# Patient Record
Sex: Female | Born: 1960 | Race: White | Hispanic: No | Marital: Single | Smoking: Never smoker
Health system: Southern US, Community
[De-identification: ages and names within clinical notes are randomized; demographics above are authoritative.]

## PROBLEM LIST (undated history)

## (undated) DIAGNOSIS — J45909 Unspecified asthma, uncomplicated: Secondary | ICD-10-CM

## (undated) DIAGNOSIS — F419 Anxiety disorder, unspecified: Secondary | ICD-10-CM

## (undated) DIAGNOSIS — M1990 Unspecified osteoarthritis, unspecified site: Secondary | ICD-10-CM

## (undated) DIAGNOSIS — I10 Essential (primary) hypertension: Secondary | ICD-10-CM

## (undated) DIAGNOSIS — G4730 Sleep apnea, unspecified: Secondary | ICD-10-CM

## (undated) HISTORY — PX: KNEE SURGERY: SHX244

## (undated) HISTORY — PX: TUBAL LIGATION: SHX77

## (undated) HISTORY — PX: ABDOMINAL SURGERY: SHX537

## (undated) HISTORY — PX: KNEE ARTHROSCOPY W/ MENISCAL REPAIR: SHX1877

## (undated) HISTORY — PX: CHOLECYSTECTOMY: SHX55

---

## 2013-01-21 ENCOUNTER — Other Ambulatory Visit (HOSPITAL_COMMUNITY): Payer: Self-pay | Admitting: Orthopaedic Surgery

## 2013-01-29 NOTE — H&P (Signed)
[facility]   A Division of [employer]   [address] [place], [place] [address] Telephone: [phone]  Fax: [phone]     PATIENT: Tina Acevedo, Tina Acevedo   MR#: [id number]  DOB: 1960/09/27       A 52 year old female returns with persistent neck pain she rates as severe that radiates from her neck into her left hand involving the long finger and index finger with numbness.  She had noticed some decreased strength in the left upper extremity, particularly with pushing, sometimes with activities that use her left hand for lifting heavier objects.  She did  not notice any specific grip weakness.  The patient has been treated with a Medrol Dosepak, has been treated on home exercise program, been through chiropractic treatment.  Prednisone pack was ineffective in April, although she felt somewhat better for about 2 days.  She has been treated with traction, manipulation, anti-inflammatories, without relief.  She is having difficulty sleeping.     CURRENT MEDICATIONS:  Include bupropion HCL ER (XL) 150 mg 1 p.o. daily, zolpidem 10 mg daily, losartan 50 mg daily, citalopram 20 mg daily, hydrochlorothiazide 25 mg daily.   ALLERGIES:  Erythromycin.    PAST MEDICAL/SURGICAL HISTORY:  Previous surgeries include knee arthroscopy of her right knee 2012, normal births 1989 and 1995, right knee lipoma removal 1991, admission in 2006 in the emergency room for several hours for hypertension and gallbladder removal 1996.     FAMILY HISTORY:  Positive for a grandmother who had colon cancer, grandfather had lung cancer, both on maternal side.    SOCIAL HISTORY:  She is married to her husband, Loraine Leriche.  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  She does not smoke, occasionally drinks a drink at dinner 2-3 times a week.    REVIEW OF SYSTEMS:  Positive for anxiety, arthritis, asthma, bronchitis, hypertension, sleep apnea, using a CPAP machine.  She has  partial non-removable dental implants.    PHYSICAL EXAMINATION:  The patient is alert and oriented.  She is 5 feet 2 inches, 212 pounds.  Extraocular movements intact.  She has significant brachial plexus tenderness on the left.  Positive Spurling on the left.  Moderate brachial tenderness on the right.  Biceps, triceps, wrist flexion/extension are normal to testing.  Reflexes are 2-3+ upper and lower and symmetrical.  Decreased sensation over the index and long finger and also radial side of her left hand.  Normal sensation right hand.  No evidence of peripheral nerve compression at the median nerve at the wrist or ulnar nerve at the elbow.     RADIOGRAPHS/TESTS:  Cervical MRI scan from 12/22/12 ordered by Dr. Loleta Dicker showed cervical spondylosis with right paracentral disk extrusion contacting the cord without significant deformity.  C6-7 showed moderate-sized disk left paracentral disk extrusion denting the ventral sac with effacement of posterior subarachnoid space.  Other levels showed no significant compression or extrusion.    PLAN:  We had a long discussion with the patient about options.  She has already been through an exercise program, chiropractic treatment, Medrol Dosepak, has been taking anti-inflammatories without relief.  She states the pain has gradually progressed over the last 6 months to the point where she feels that something has to be done about this and it bothers her on a daily basis with activities of daily living as well as wakes her up at night when she tries to sleep.  She has had a subacromial injection in her  shoulder, which did not change her symptoms.  We discussed options.  The patient would like to proceed with surgical treatment due to her progressive symptoms and failure of conservative treatment now greater than 6 months.  The plan would be 2-level anterior cervical diskectomy and fusion for removal of the paracentral disk, which is symptomatic at C6-7.  She has some  disk extrusion at C5-6 as well with spondylitic changes and the plan would be 2-level fusion, anterior plating, soft cervical collar x6 weeks postop, overnight stay in the hospital.  We discussed general anesthesia, operative technique, risks of surgery including dysphagia, dysphonia, pseudoarthrosis.  She understands and requests we proceed.     For additional information please see handwritten notes, reports, orders and prescriptions in this chart.      Ameenah Prosser C. Ophelia Charter, M.D.    Auto-Authenticated by Veverly Fells. Ophelia Charter, M.D.

## 2013-02-02 ENCOUNTER — Other Ambulatory Visit (HOSPITAL_COMMUNITY): Payer: Self-pay | Admitting: Orthopaedic Surgery

## 2013-02-02 MED ORDER — CEFAZOLIN SODIUM-DEXTROSE 2-3 GM-% IV SOLR
2.0000 g | INTRAVENOUS | Status: AC
  Administered 2013-02-03: 2 g via INTRAVENOUS

## 2013-02-02 NOTE — Progress Notes (Signed)
I was unable to reach patient by phone.  I left  A message on voice mail.  I instructed the patient to arrive at [facility] Main entrance at 10am, nothing to eat or drink after midnight.  I instructed patient to not take any medications in the am and to bring a  list of current medications  I asked patient to not wear any lotions, powders, cologne, jewelry, piercing, make-up or nail polish.  I asked the patient to call [phone], in the am if there were any questions or problems.

## 2013-02-03 ENCOUNTER — Encounter (HOSPITAL_COMMUNITY): Payer: Self-pay | Admitting: *Deleted

## 2013-02-03 ENCOUNTER — Encounter (HOSPITAL_COMMUNITY): Payer: BC Managed Care – PPO | Admitting: Anesthesiology

## 2013-02-03 ENCOUNTER — Ambulatory Visit (HOSPITAL_COMMUNITY): Payer: BC Managed Care – PPO

## 2013-02-03 ENCOUNTER — Inpatient Hospital Stay (HOSPITAL_COMMUNITY)
Admission: RE | Admit: 2013-02-03 | Discharge: 2013-02-04 | DRG: 473 | Disposition: A | Discharge status: Home / Self Care | Payer: BC Managed Care – PPO | Source: Ambulatory Visit | Attending: Orthopaedic Surgery | Admitting: Orthopaedic Surgery

## 2013-02-03 ENCOUNTER — Encounter (HOSPITAL_COMMUNITY): Payer: Self-pay | Admitting: Pharmacy Technician

## 2013-02-03 ENCOUNTER — Ambulatory Visit (HOSPITAL_COMMUNITY): Payer: BC Managed Care – PPO | Admitting: Anesthesiology

## 2013-02-03 ENCOUNTER — Encounter (HOSPITAL_COMMUNITY)
Admission: RE | Disposition: A | Discharge status: Home / Self Care | Payer: Self-pay | Source: Ambulatory Visit | Attending: Orthopaedic Surgery

## 2013-02-03 DIAGNOSIS — M47812 Spondylosis without myelopathy or radiculopathy, cervical region: Principal | ICD-10-CM | POA: Diagnosis present

## 2013-02-03 DIAGNOSIS — M5020 Other cervical disc displacement, unspecified cervical region: Secondary | ICD-10-CM | POA: Diagnosis present

## 2013-02-03 DIAGNOSIS — Z800 Family history of malignant neoplasm of digestive organs: Secondary | ICD-10-CM

## 2013-02-03 DIAGNOSIS — F411 Generalized anxiety disorder: Secondary | ICD-10-CM | POA: Diagnosis present

## 2013-02-03 DIAGNOSIS — J45909 Unspecified asthma, uncomplicated: Secondary | ICD-10-CM | POA: Diagnosis present

## 2013-02-03 DIAGNOSIS — Z801 Family history of malignant neoplasm of trachea, bronchus and lung: Secondary | ICD-10-CM

## 2013-02-03 DIAGNOSIS — I10 Essential (primary) hypertension: Secondary | ICD-10-CM | POA: Diagnosis present

## 2013-02-03 DIAGNOSIS — G4730 Sleep apnea, unspecified: Secondary | ICD-10-CM | POA: Diagnosis present

## 2013-02-03 HISTORY — DX: Unspecified osteoarthritis, unspecified site: M19.90

## 2013-02-03 HISTORY — DX: Anxiety disorder, unspecified: F41.9

## 2013-02-03 HISTORY — DX: Sleep apnea, unspecified: G47.30

## 2013-02-03 HISTORY — DX: Essential (primary) hypertension: I10

## 2013-02-03 HISTORY — PX: ANTERIOR CERVICAL DECOMP/DISCECTOMY FUSION: SHX1161

## 2013-02-03 HISTORY — DX: Unspecified asthma, uncomplicated: J45.909

## 2013-02-03 LAB — URINALYSIS, ROUTINE W REFLEX MICROSCOPIC
Glucose, UA: NEGATIVE mg/dL
Protein, ur: NEGATIVE mg/dL
Specific Gravity, Urine: 1.02 (ref 1.005–1.030)
pH: 5.5 (ref 5.0–8.0)

## 2013-02-03 LAB — COMPREHENSIVE METABOLIC PANEL
ALT: 23 U/L (ref 0–35)
AST: 18 U/L (ref 0–37)
Albumin: 4.1 g/dL (ref 3.5–5.2)
Alkaline Phosphatase: 81 U/L (ref 39–117)
Calcium: 9.5 mg/dL (ref 8.4–10.5)
Glucose, Bld: 102 mg/dL — ABNORMAL HIGH (ref 70–99)
Potassium: 4.1 mEq/L (ref 3.5–5.1)
Sodium: 140 mEq/L (ref 135–145)
Total Bilirubin: 0.3 mg/dL (ref 0.3–1.2)
Total Protein: 7.6 g/dL (ref 6.0–8.3)

## 2013-02-03 LAB — CBC
Hemoglobin: 12.8 g/dL (ref 12.0–15.0)
MCH: 32 pg (ref 26.0–34.0)
MCHC: 34.4 g/dL (ref 30.0–36.0)
Platelets: 199 10*3/uL (ref 150–400)
WBC: 5.4 10*3/uL (ref 4.0–10.5)

## 2013-02-03 LAB — URINE MICROSCOPIC-ADD ON

## 2013-02-03 LAB — SURGICAL PCR SCREEN
MRSA, PCR: NEGATIVE
Staphylococcus aureus: NEGATIVE

## 2013-02-03 LAB — PROTIME-INR: Prothrombin Time: 12.7 seconds (ref 11.6–15.2)

## 2013-02-03 SURGERY — ANTERIOR CERVICAL DECOMPRESSION/DISCECTOMY FUSION 2 LEVELS
Anesthesia: General | Site: Neck | Wound class: Clean

## 2013-02-03 MED ORDER — HYDROMORPHONE HCL PF 1 MG/ML IJ SOLN
INTRAMUSCULAR | Status: AC
  Filled 2013-02-03: qty 1

## 2013-02-03 MED ORDER — ARTIFICIAL TEARS OP OINT
TOPICAL_OINTMENT | OPHTHALMIC | Status: DC | PRN
  Administered 2013-02-03: 1 via OPHTHALMIC

## 2013-02-03 MED ORDER — NEOSTIGMINE METHYLSULFATE 1 MG/ML IJ SOLN
INTRAMUSCULAR | Status: DC | PRN
  Administered 2013-02-03: 5 mg via INTRAVENOUS

## 2013-02-03 MED ORDER — DEXTROSE 5 % IV SOLN
10.0000 mg | INTRAVENOUS | Status: DC | PRN
  Administered 2013-02-03: 20 ug/min via INTRAVENOUS

## 2013-02-03 MED ORDER — HYDROCODONE-ACETAMINOPHEN 5-325 MG PO TABS
1.0000 | ORAL_TABLET | ORAL | Status: DC | PRN
  Administered 2013-02-03 – 2013-02-04 (×2): 2 via ORAL
  Filled 2013-02-03 (×2): qty 2

## 2013-02-03 MED ORDER — BISACODYL 10 MG RE SUPP: 10.0000 mg | Freq: Every day | RECTAL | Status: DC | PRN

## 2013-02-03 MED ORDER — ROCURONIUM BROMIDE 100 MG/10ML IV SOLN
INTRAVENOUS | Status: DC | PRN
  Administered 2013-02-03: 50 mg via INTRAVENOUS
  Administered 2013-02-03: 10 mg via INTRAVENOUS
  Administered 2013-02-03: 20 mg via INTRAVENOUS
  Administered 2013-02-03: 10 mg via INTRAVENOUS

## 2013-02-03 MED ORDER — HEMOSTATIC AGENTS (NO CHARGE) OPTIME
TOPICAL | Status: DC | PRN
  Administered 2013-02-03: 1 via TOPICAL

## 2013-02-03 MED ORDER — ONDANSETRON HCL 4 MG/2ML IJ SOLN
4.0000 mg | INTRAMUSCULAR | Status: DC | PRN
  Administered 2013-02-03: 4 mg via INTRAVENOUS
  Filled 2013-02-03: qty 2

## 2013-02-03 MED ORDER — METHOCARBAMOL 500 MG PO TABS
500.0000 mg | ORAL_TABLET | Freq: Four times a day (QID) | ORAL | Status: DC | PRN
  Administered 2013-02-03 – 2013-02-04 (×3): 500 mg via ORAL
  Filled 2013-02-03 (×2): qty 1

## 2013-02-03 MED ORDER — LACTATED RINGERS IV SOLN
INTRAVENOUS | Status: DC
  Administered 2013-02-03: 12:00:00 via INTRAVENOUS

## 2013-02-03 MED ORDER — LACTATED RINGERS IV SOLN
INTRAVENOUS | Status: DC | PRN
  Administered 2013-02-03 (×2): via INTRAVENOUS

## 2013-02-03 MED ORDER — ONDANSETRON HCL 4 MG/2ML IJ SOLN
INTRAMUSCULAR | Status: AC
  Filled 2013-02-03: qty 2

## 2013-02-03 MED ORDER — PROPOFOL 10 MG/ML IV BOLUS
INTRAVENOUS | Status: DC | PRN
  Administered 2013-02-03: 200 mg via INTRAVENOUS

## 2013-02-03 MED ORDER — FLEET ENEMA 7-19 GM/118ML RE ENEM: 1.0000 | ENEMA | Freq: Once | RECTAL | Status: AC | PRN

## 2013-02-03 MED ORDER — PANTOPRAZOLE SODIUM 40 MG IV SOLR
40.0000 mg | Freq: Every day | INTRAVENOUS | Status: DC
  Administered 2013-02-03: 40 mg via INTRAVENOUS
  Filled 2013-02-03 (×2): qty 40

## 2013-02-03 MED ORDER — METHOCARBAMOL 100 MG/ML IJ SOLN
500.0000 mg | Freq: Four times a day (QID) | INTRAMUSCULAR | Status: DC | PRN
  Filled 2013-02-03: qty 5

## 2013-02-03 MED ORDER — ONDANSETRON HCL 4 MG/2ML IJ SOLN
INTRAMUSCULAR | Status: DC | PRN
  Administered 2013-02-03: 4 mg via INTRAVENOUS

## 2013-02-03 MED ORDER — ACETAMINOPHEN 325 MG PO TABS: 650.0000 mg | ORAL_TABLET | ORAL | Status: DC | PRN

## 2013-02-03 MED ORDER — THROMBIN 5000 UNITS EX SOLR
CUTANEOUS | Status: DC | PRN
  Administered 2013-02-03: 5000 [IU] via TOPICAL

## 2013-02-03 MED ORDER — GLYCOPYRROLATE 0.2 MG/ML IJ SOLN
INTRAMUSCULAR | Status: DC | PRN
  Administered 2013-02-03: .6 mg via INTRAVENOUS

## 2013-02-03 MED ORDER — FENTANYL CITRATE 0.05 MG/ML IJ SOLN
INTRAMUSCULAR | Status: DC | PRN
  Administered 2013-02-03: 150 ug via INTRAVENOUS
  Administered 2013-02-03: 100 ug via INTRAVENOUS
  Administered 2013-02-03: 150 ug via INTRAVENOUS
  Administered 2013-02-03: 50 ug via INTRAVENOUS

## 2013-02-03 MED ORDER — OXYCODONE-ACETAMINOPHEN 5-325 MG PO TABS
ORAL_TABLET | ORAL | Status: AC
  Filled 2013-02-03: qty 2

## 2013-02-03 MED ORDER — KCL IN DEXTROSE-NACL 20-5-0.45 MEQ/L-%-% IV SOLN
INTRAVENOUS | Status: DC
  Administered 2013-02-03 – 2013-02-04 (×2): via INTRAVENOUS
  Filled 2013-02-03 (×3): qty 1000

## 2013-02-03 MED ORDER — LIDOCAINE HCL (CARDIAC) 20 MG/ML IV SOLN
INTRAVENOUS | Status: DC | PRN
  Administered 2013-02-03: 80 mg via INTRAVENOUS

## 2013-02-03 MED ORDER — SODIUM CHLORIDE 0.9 % IV SOLN: 250.0000 mL | INTRAVENOUS | Status: DC

## 2013-02-03 MED ORDER — MENTHOL 3 MG MT LOZG: 1.0000 | LOZENGE | OROMUCOSAL | Status: DC | PRN

## 2013-02-03 MED ORDER — SENNOSIDES-DOCUSATE SODIUM 8.6-50 MG PO TABS: 1.0000 | ORAL_TABLET | Freq: Every evening | ORAL | Status: DC | PRN

## 2013-02-03 MED ORDER — MUPIROCIN 2 % EX OINT
TOPICAL_OINTMENT | CUTANEOUS | Status: AC
  Administered 2013-02-03: 1
  Filled 2013-02-03: qty 22

## 2013-02-03 MED ORDER — BUPIVACAINE-EPINEPHRINE 0.25% -1:200000 IJ SOLN
INTRAMUSCULAR | Status: DC | PRN
  Administered 2013-02-03: 6 mL

## 2013-02-03 MED ORDER — ONDANSETRON HCL 4 MG/2ML IJ SOLN
4.0000 mg | Freq: Once | INTRAMUSCULAR | Status: AC | PRN
  Administered 2013-02-03: 4 mg via INTRAVENOUS

## 2013-02-03 MED ORDER — ZOLPIDEM TARTRATE 5 MG PO TABS: 10.0000 mg | ORAL_TABLET | Freq: Every day | ORAL | Status: DC | PRN

## 2013-02-03 MED ORDER — 0.9 % SODIUM CHLORIDE (POUR BTL) OPTIME
TOPICAL | Status: DC | PRN
  Administered 2013-02-03: 1000 mL

## 2013-02-03 MED ORDER — METHOCARBAMOL 500 MG PO TABS
ORAL_TABLET | ORAL | Status: AC
  Filled 2013-02-03: qty 1

## 2013-02-03 MED ORDER — DOCUSATE SODIUM 100 MG PO CAPS
100.0000 mg | ORAL_CAPSULE | Freq: Two times a day (BID) | ORAL | Status: DC
  Administered 2013-02-03 – 2013-02-04 (×2): 100 mg via ORAL
  Filled 2013-02-03 (×2): qty 1

## 2013-02-03 MED ORDER — ZOLPIDEM TARTRATE 5 MG PO TABS: 5.0000 mg | ORAL_TABLET | Freq: Every evening | ORAL | Status: DC | PRN

## 2013-02-03 MED ORDER — OXYCODONE-ACETAMINOPHEN 5-325 MG PO TABS
1.0000 | ORAL_TABLET | ORAL | Status: DC | PRN
  Administered 2013-02-03 – 2013-02-04 (×2): 2 via ORAL
  Filled 2013-02-03: qty 2

## 2013-02-03 MED ORDER — ALBUTEROL SULFATE HFA 108 (90 BASE) MCG/ACT IN AERS: 2.0000 | INHALATION_SPRAY | Freq: Four times a day (QID) | RESPIRATORY_TRACT | Status: DC | PRN

## 2013-02-03 MED ORDER — CITALOPRAM HYDROBROMIDE 20 MG PO TABS
20.0000 mg | ORAL_TABLET | Freq: Every day | ORAL | Status: DC
  Administered 2013-02-03 – 2013-02-04 (×2): 20 mg via ORAL
  Filled 2013-02-03 (×2): qty 1

## 2013-02-03 MED ORDER — OXYCODONE-ACETAMINOPHEN 5-325 MG PO TABS: 1.0000 | ORAL_TABLET | ORAL | Status: AC | PRN

## 2013-02-03 MED ORDER — BUPROPION HCL ER (XL) 150 MG PO TB24
150.0000 mg | ORAL_TABLET | Freq: Every day | ORAL | Status: DC
  Administered 2013-02-03 – 2013-02-04 (×2): 150 mg via ORAL
  Filled 2013-02-03 (×2): qty 1

## 2013-02-03 MED ORDER — MORPHINE SULFATE 2 MG/ML IJ SOLN: 1.0000 mg | INTRAMUSCULAR | Status: DC | PRN

## 2013-02-03 MED ORDER — ACETAMINOPHEN 650 MG RE SUPP: 650.0000 mg | RECTAL | Status: DC | PRN

## 2013-02-03 MED ORDER — HYDROMORPHONE HCL PF 1 MG/ML IJ SOLN
0.2500 mg | INTRAMUSCULAR | Status: DC | PRN
  Administered 2013-02-03 (×2): 0.5 mg via INTRAVENOUS

## 2013-02-03 MED ORDER — MIDAZOLAM HCL 5 MG/5ML IJ SOLN
INTRAMUSCULAR | Status: DC | PRN
  Administered 2013-02-03: 2 mg via INTRAVENOUS

## 2013-02-03 MED ORDER — LORATADINE 10 MG PO TABS
10.0000 mg | ORAL_TABLET | Freq: Every day | ORAL | Status: DC
  Administered 2013-02-03 – 2013-02-04 (×2): 10 mg via ORAL
  Filled 2013-02-03 (×2): qty 1

## 2013-02-03 MED ORDER — LOSARTAN POTASSIUM 50 MG PO TABS
50.0000 mg | ORAL_TABLET | Freq: Every day | ORAL | Status: DC
  Administered 2013-02-03 – 2013-02-04 (×2): 50 mg via ORAL
  Filled 2013-02-03 (×2): qty 1

## 2013-02-03 MED ORDER — SODIUM CHLORIDE 0.9 % IJ SOLN
3.0000 mL | Freq: Two times a day (BID) | INTRAMUSCULAR | Status: DC
  Administered 2013-02-03: 3 mL via INTRAVENOUS

## 2013-02-03 MED ORDER — CEFAZOLIN SODIUM 1-5 GM-% IV SOLN
1.0000 g | Freq: Once | INTRAVENOUS | Status: AC
  Administered 2013-02-03: 1 g via INTRAVENOUS
  Filled 2013-02-03: qty 50

## 2013-02-03 MED ORDER — HYDROCHLOROTHIAZIDE 25 MG PO TABS
25.0000 mg | ORAL_TABLET | Freq: Every day | ORAL | Status: DC
  Administered 2013-02-03 – 2013-02-04 (×2): 25 mg via ORAL
  Filled 2013-02-03 (×2): qty 1

## 2013-02-03 MED ORDER — LIDOCAINE HCL 4 % MT SOLN
OROMUCOSAL | Status: DC | PRN
  Administered 2013-02-03: 4 mL via TOPICAL

## 2013-02-03 MED ORDER — PHENOL 1.4 % MT LIQD: 1.0000 | OROMUCOSAL | Status: DC | PRN

## 2013-02-03 MED ORDER — SODIUM CHLORIDE 0.9 % IJ SOLN: 3.0000 mL | INTRAMUSCULAR | Status: DC | PRN

## 2013-02-03 MED ORDER — THROMBIN 5000 UNITS EX SOLR
CUTANEOUS | Status: AC
  Filled 2013-02-03: qty 5000

## 2013-02-03 MED ORDER — METHOCARBAMOL 500 MG PO TABS: 500.0000 mg | ORAL_TABLET | Freq: Four times a day (QID) | ORAL | Status: AC | PRN

## 2013-02-03 SURGICAL SUPPLY — 62 items
ADH SKN CLS APL DERMABOND .7 (GAUZE/BANDAGES/DRESSINGS) ×1
BIT DRILL SKYLINE 12MM (BIT) IMPLANT
BLADE SURG ROTATE 9660 (MISCELLANEOUS) IMPLANT
BONE CERV LORDOTIC 14.5X12X6 (Bone Implant) ×4 IMPLANT
BUR ROUND FLUTED 4 SOFT TCH (BURR) ×1 IMPLANT
CLOTH BEACON ORANGE TIMEOUT ST (SAFETY) ×2 IMPLANT
COLLAR CERV LO CONTOUR FIRM DE (SOFTGOODS) ×2 IMPLANT
CORDS BIPOLAR (ELECTRODE) ×1 IMPLANT
COVER MAYO STAND STRL (DRAPES) ×1 IMPLANT
COVER SURGICAL LIGHT HANDLE (MISCELLANEOUS) ×2 IMPLANT
DERMABOND ADVANCED (GAUZE/BANDAGES/DRESSINGS) ×1
DERMABOND ADVANCED .7 DNX12 (GAUZE/BANDAGES/DRESSINGS) ×1 IMPLANT
DRAPE C-ARM 42X72 X-RAY (DRAPES) ×2 IMPLANT
DRAPE MICROSCOPE LEICA (MISCELLANEOUS) ×2 IMPLANT
DRAPE PROXIMA HALF (DRAPES) ×2 IMPLANT
DRILL BIT SKYLINE 12MM (BIT) ×2
DRSG MEPILEX BORDER 4X4 (GAUZE/BANDAGES/DRESSINGS) ×2 IMPLANT
DRSG MEPILEX BORDER 4X8 (GAUZE/BANDAGES/DRESSINGS) IMPLANT
DURAPREP 6ML APPLICATOR 50/CS (WOUND CARE) ×2 IMPLANT
ELECT COATED BLADE 2.86 ST (ELECTRODE) ×2 IMPLANT
ELECT REM PT RETURN 9FT ADLT (ELECTROSURGICAL) ×2
ELECTRODE REM PT RTRN 9FT ADLT (ELECTROSURGICAL) ×1 IMPLANT
EVACUATOR 1/8 PVC DRAIN (DRAIN) ×1 IMPLANT
GAUZE XEROFORM 1X8 LF (GAUZE/BANDAGES/DRESSINGS) ×1 IMPLANT
GLOVE BIO SURGEON STRL SZ7 (GLOVE) ×3 IMPLANT
GLOVE BIOGEL PI IND STRL 7.0 (GLOVE) ×1 IMPLANT
GLOVE BIOGEL PI IND STRL 7.5 (GLOVE) ×1 IMPLANT
GLOVE BIOGEL PI IND STRL 8 (GLOVE) ×1 IMPLANT
GLOVE BIOGEL PI INDICATOR 7.0 (GLOVE) ×1
GLOVE BIOGEL PI INDICATOR 7.5 (GLOVE) ×2
GLOVE BIOGEL PI INDICATOR 8 (GLOVE) ×1
GLOVE ECLIPSE 7.0 STRL STRAW (GLOVE) ×2 IMPLANT
GLOVE ORTHO TXT STRL SZ7.5 (GLOVE) ×2 IMPLANT
GLOVE SURG SS PI 6.5 STRL IVOR (GLOVE) ×1 IMPLANT
GOWN PREVENTION PLUS LG XLONG (DISPOSABLE) IMPLANT
GOWN PREVENTION PLUS XLARGE (GOWN DISPOSABLE) ×3 IMPLANT
GOWN STRL NON-REIN LRG LVL3 (GOWN DISPOSABLE) ×3 IMPLANT
GOWN STRL REIN XL XLG (GOWN DISPOSABLE) ×3 IMPLANT
GRAFT BNE SPCR VG2 14.5X12X6 (Bone Implant) IMPLANT
HEAD HALTER (SOFTGOODS) ×2 IMPLANT
HEMOSTAT SURGICEL 2X14 (HEMOSTASIS) IMPLANT
KIT BASIN OR (CUSTOM PROCEDURE TRAY) ×2 IMPLANT
KIT ROOM TURNOVER OR (KITS) ×2 IMPLANT
MANIFOLD NEPTUNE II (INSTRUMENTS) ×1 IMPLANT
NDL 25GX 5/8IN NON SAFETY (NEEDLE) ×1 IMPLANT
NEEDLE 25GX 5/8IN NON SAFETY (NEEDLE) ×2 IMPLANT
NS IRRIG 1000ML POUR BTL (IV SOLUTION) ×2 IMPLANT
PACK ORTHO CERVICAL (CUSTOM PROCEDURE TRAY) ×2 IMPLANT
PAD ARMBOARD 7.5X6 YLW CONV (MISCELLANEOUS) ×4 IMPLANT
PATTIES SURGICAL .5 X.5 (GAUZE/BANDAGES/DRESSINGS) IMPLANT
PIN TEMP SKYLINE THREADED (PIN) ×2 IMPLANT
PLATE TWO LEVEL SKYLINE 30MM (Plate) ×1 IMPLANT
SCREW VARIABLE SELF TAP 12MM (Screw) ×6 IMPLANT
SPONGE GAUZE 4X4 12PLY (GAUZE/BANDAGES/DRESSINGS) IMPLANT
SPONGE SURGIFOAM ABS GEL SZ50 (HEMOSTASIS) ×2 IMPLANT
SURGIFLO TRUKIT (HEMOSTASIS) IMPLANT
SUT VIC AB 3-0 X1 27 (SUTURE) ×2 IMPLANT
SUT VICRYL 4-0 PS2 18IN ABS (SUTURE) ×4 IMPLANT
SYR 30ML SLIP (SYRINGE) ×2 IMPLANT
TOWEL OR 17X24 6PK STRL BLUE (TOWEL DISPOSABLE) ×2 IMPLANT
TOWEL OR 17X26 10 PK STRL BLUE (TOWEL DISPOSABLE) ×2 IMPLANT
WATER STERILE IRR 1000ML POUR (IV SOLUTION) ×1 IMPLANT

## 2013-02-03 NOTE — Anesthesia Preprocedure Evaluation (Signed)
Anesthesia Evaluation  Patient identified by MRN, date of birth, ID band Patient awake    Reviewed: Allergy & Precautions, H&P , NPO status , Patient's Chart, lab work & pertinent test results  Airway       Dental   Pulmonary asthma , sleep apnea ,          Cardiovascular hypertension,     Neuro/Psych    GI/Hepatic   Endo/Other    Renal/GU      Musculoskeletal   Abdominal   Peds  Hematology   Anesthesia Other Findings   Reproductive/Obstetrics                           Anesthesia Physical Anesthesia Plan  ASA: II  Anesthesia Plan: General   Post-op Pain Management:    Induction: Intravenous  Airway Management Planned:   Additional Equipment:   Intra-op Plan:   Post-operative Plan: Extubation in OR  Informed Consent: I have reviewed the patients History and Physical, chart, labs and discussed the procedure including the risks, benefits and alternatives for the proposed anesthesia with the patient or authorized representative who has indicated his/her understanding and acceptance.     Plan Discussed with: CRNA, Anesthesiologist and Surgeon  Anesthesia Plan Comments:         Anesthesia Quick Evaluation

## 2013-02-03 NOTE — Anesthesia Postprocedure Evaluation (Signed)
  Anesthesia Post-op Note  Patient: Tina Acevedo  Procedure(s) Performed: Procedure(s) with comments: ANTERIOR CERVICAL DECOMPRESSION/DISCECTOMY FUSION 2 LEVELS (N/A) - C5-6 C6-7 Anterior Cervical Discectomy and Fusion, Allograft, Plate  Patient Location: PACU  Anesthesia Type:General  Level of Consciousness: awake, alert , oriented and patient cooperative  Airway and Oxygen Therapy: Patient Spontanous Breathing  Post-op Pain: mild  Post-op Assessment: Post-op Vital signs reviewed, Patient's Cardiovascular Status Stable, Respiratory Function Stable, Patent Airway, No signs of Nausea or vomiting and Pain level controlled  Post-op Vital Signs: stable  Complications: No apparent anesthesia complications

## 2013-02-03 NOTE — H&P (View-Only) (Signed)
I was unable to reach patient by phone.  I left  A message on voice mail.  I instructed the patient to arrive at [facility] Main entrance at 10am, nothing to eat or drink after midnight.  I instructed patient to not take any medications in the am and to bring a  list of current medications  I asked patient to not wear any lotions, powders, cologne, jewelry, piercing, make-up or nail polish.  I asked the patient to call [phone], in the am if there were any questions or problems. 

## 2013-02-03 NOTE — Interval H&P Note (Signed)
History and Physical Interval Note:  02/03/2013 12:52 PM  Tina Acevedo  has presented today for surgery, with the diagnosis of C5-6, C6-7 Cervical Spondylosis with HNP  The various methods of treatment have been discussed with the patient and family. After consideration of risks, benefits and other options for treatment, the patient has consented to  Procedure(s) with comments: ANTERIOR CERVICAL DECOMPRESSION/DISCECTOMY FUSION 2 LEVELS (N/A) - C5-6 C6-7 Anterior Cervical Discectomy and Fusion, Allograft, Plate as a surgical intervention .  The patient's history has been reviewed, patient examined, no change in status, stable for surgery.  I have reviewed the patient's chart and labs.  Questions were answered to the patient's satisfaction.     YATES,MARK C

## 2013-02-03 NOTE — Anesthesia Procedure Notes (Addendum)
Performed by: Coralee Rud   Procedure Name: Intubation Date/Time: 02/03/2013 1:30 PM Performed by: Coralee Rud Pre-anesthesia Checklist: Patient identified, Emergency Drugs available, Suction available and Patient being monitored Patient Re-evaluated:Patient Re-evaluated prior to inductionOxygen Delivery Method: Circle system utilized Preoxygenation: Pre-oxygenation with 100% oxygen Intubation Type: IV induction Ventilation: Mask ventilation without difficulty Laryngoscope Size: Miller and 3 Grade View: Grade II Tube type: Oral Tube size: 7.5 mm Number of attempts: 1 Airway Equipment and Method: Bougie stylet Placement Confirmation: ETT inserted through vocal cords under direct vision,  positive ETCO2 and breath sounds checked- equal and bilateral Secured at: 22 cm Tube secured with: Tape Dental Injury: Teeth and Oropharynx as per pre-operative assessment  Comments: Elective Bougie intubation.  Anterior larynx, small mouth and full dentition.

## 2013-02-03 NOTE — Interval H&P Note (Signed)
History and Physical Interval Note:  02/03/2013 12:51 PM  Tina Acevedo  has presented today for surgery, with the diagnosis of C5-6, C6-7 Cervical Spondylosis with HNP  The various methods of treatment have been discussed with the patient and family. After consideration of risks, benefits and other options for treatment, the patient has consented to  Procedure(s) with comments: ANTERIOR CERVICAL DECOMPRESSION/DISCECTOMY FUSION 2 LEVELS (N/A) - C5-6 C6-7 Anterior Cervical Discectomy and Fusion, Allograft, Plate as a surgical intervention .  The patient's history has been reviewed, patient examined, no change in status, stable for surgery.  I have reviewed the patient's chart and labs.  Questions were answered to the patient's satisfaction.     Jennah Satchell C

## 2013-02-03 NOTE — Brief Op Note (Signed)
02/03/2013  3:43 PM  PATIENT:  Tina Acevedo  52 y.o. female  PRE-OPERATIVE DIAGNOSIS:  C5-6, C6-7 Cervical Spondylosis with HNP  POST-OPERATIVE DIAGNOSIS:  C5-6, C6-7 Cervical Spondylosis with HNP  PROCEDURE:  Procedure(s) with comments: ANTERIOR CERVICAL DECOMPRESSION/DISCECTOMY FUSION 2 LEVELS (N/A) - C5-6 C6-7 Anterior Cervical Discectomy and Fusion, Allograft, Plate  SURGEON:  Surgeon(s) and Role:    * Eldred Manges, MD - Primary  PHYSICIAN ASSISTANT: Maurie Boettcher  ASSISTANTS: none   ANESTHESIA:   general  EBL:  Total I/O In: 1000 [I.V.:1000] Out: 50 [Blood:50]  BLOOD ADMINISTERED:none  DRAINS: (1) Hemovact drain(s) in the anterior nec, with  Suction Open   LOCAL MEDICATIONS USED:  MARCAINE     SPECIMEN:  No Specimen  DISPOSITION OF SPECIMEN:  N/A  COUNTS:  YES  TOURNIQUET:  * No tourniquets in log *  DICTATION: .Note written in EPIC  PLAN OF CARE: Admit to inpatient   PATIENT DISPOSITION:  PACU - hemodynamically stable.   Delay start of Pharmacological VTE agent (>24hrs) due to surgical blood loss or risk of bleeding: yes

## 2013-02-03 NOTE — Transfer of Care (Signed)
Immediate Anesthesia Transfer of Care Note  Patient: Tina Acevedo  Procedure(s) Performed: Procedure(s) with comments: ANTERIOR CERVICAL DECOMPRESSION/DISCECTOMY FUSION 2 LEVELS (N/A) - C5-6 C6-7 Anterior Cervical Discectomy and Fusion, Allograft, Plate  Patient Location: PACU  Anesthesia Type:General  Level of Consciousness: awake, alert , oriented and patient cooperative  Airway & Oxygen Therapy: Patient Spontanous Breathing and Patient connected to nasal cannula oxygen  Post-op Assessment: Report given to PACU RN, Post -op Vital signs reviewed and stable and Patient moving all extremities  Post vital signs: Reviewed and stable  Complications: No apparent anesthesia complications

## 2013-02-04 LAB — URINE CULTURE: Colony Count: 6000

## 2013-02-04 NOTE — Progress Notes (Signed)
Subjective: 1 Day Post-Op Procedure(s) (LRB): ANTERIOR CERVICAL DECOMPRESSION/DISCECTOMY FUSION 2 LEVELS (N/A) Patient reports pain as mild.    Objective: Vital signs in last 24 hours: Temp:  [97 F (36.1 C)-98.5 F (36.9 C)] 97.5 F (36.4 C) (11/20 0433) Pulse Rate:  [57-89] 57 (11/20 0433) Resp:  [12-21] 16 (11/20 0433) BP: (99-156)/(56-92) 99/56 mmHg (11/20 0433) SpO2:  [81 %-100 %] 98 % (11/20 0433) Weight:  [98.601 kg (217 lb 6 oz)] 98.601 kg (217 lb 6 oz) (11/19 1121)  Intake/Output from previous day: 11/19 0701 - 11/20 0700 In: 2452 [I.V.:2452] Out: 50 [Blood:50] Intake/Output this shift:     Recent Labs  02/03/13 1051  HGB 12.8    Recent Labs  02/03/13 1051  WBC 5.4  RBC 4.00  HCT 37.2  PLT 199    Recent Labs  02/03/13 1051  NA 140  K 4.1  CL 102  CO2 27  BUN 11  CREATININE 0.87  GLUCOSE 102*  CALCIUM 9.5    Recent Labs  02/03/13 1051  INR 0.97    Neurologically intact  Assessment/Plan: 1 Day Post-Op Procedure(s) (LRB): ANTERIOR CERVICAL DECOMPRESSION/DISCECTOMY FUSION 2 LEVELS (N/A) Plan:  Discharge home. No arm pain, good relief of preop UE numbness and weakness.   Karysa Heft C 02/04/2013, 7:50 AM

## 2013-02-04 NOTE — Progress Notes (Signed)
Orthopedic Tech Progress Note Patient Details:  Tina Acevedo 04/19/1960 [id number]  Ortho Devices Type of Ortho Device: Soft collar   Haskell Flirt 02/04/2013, 8:07 AM

## 2013-02-04 NOTE — Op Note (Signed)
Tina Acevedo, Tina Acevedo NO.:  [id number]  MEDICAL RECORD NO.:  [id number]  LOCATION:  [id number]                        FACILITY:  [facility]  PHYSICIAN:  Zach Tietje C. Ophelia Charter, M.D.    DATE OF BIRTH:  Jan 19, 1961  DATE OF PROCEDURE:  02/03/2013 DATE OF DISCHARGE:                              OPERATIVE REPORT   PREOPERATIVE DIAGNOSES:  Cervical spondylosis, C5-6, C6-7, with C6-7 herniated nucleus pulposus.  POSTOPERATIVE DIAGNOSIS:  Cervical spondylosis, C5-6, C6-7, with C6-7 herniated nucleus pulposus.  PROCEDURE:  C5-6, C6-7 anterior cervical discectomy and fusion, allograft and plate.  DePuy Skyline plate and 12 mm screws x6.  SURGEON:  Liam Cammarata C. Ophelia Charter, M.D.  ASSISTANT:  Maud Deed, PA-C, medically necessary and present for the entire procedure.  ANESTHESIA:  GOT plus Marcaine skin local 6 mL.  DRAINS:  One Hemovac neck.  COMPLICATIONS:  None.  PROCEDURE IN DETAIL:  After induction of general anesthesia, head halter traction.  Arms tucked at the side __________ Preoperative Ancef prophylaxis, time-out procedure.  Neck was prepped with DuraPrep.  The area was squared with towels, sterile skin marker was used in the prominent skin fold based on palpable landmarks and Betadine, Steri- Drape was applied, thyroid sheets, sterile Mayo stand at the head. Incision was made starting at the midline extending to the left directly over the C6 vertebral body based on cricothyroid palpation and carotid tubercle as well as position of the clavicle.  Platysma was divided in line with the fibers.  Blunt dissection below the omohyoid down to the prominent spur at C6-7 was performed and then at the C5-6 level where there was a small anterior spur short 25 needle was placed with a straight clamp confirmed with cross-table lateral x-ray.  C6-7 had moderate-sized left paracentral disc extrusion indenting the spinal cord with compression and C6-7 was operatively addressed.  First was  Cloward teeth retractors right and left, smooth blades up and down.  Discectomy was performed.  Bur was used to remove the spurs anteriorly.  Operative microscope was draped and brought in and remaining portion of the disk material was removed down to the posterior longitudinal ligament, taking chunks of disk off and decompressing until dura was completely visualized all the way across and a trial sizer for 6 gave a tight fit. Uncovertebral spurs were removed right and left.  There were some epidural veins that elliptically where the space was restored that had narrowed by 50% at C6-7.  Anterior spurs off of C6 and C7, primarily C6 had been removed with the bur to allow the plate to sit down flat against the vertebral body.  Thrombin Gelfoam was placed and then final graft was opened. Corticocancellous marked anteriorly at the midline.  CRNA pulled on head halter traction as the graft was inserted countersunk 2 mm.  Identical procedure was repeated at C5-6.  At this level, the disk space was wider and still fit with a 6 mm graft.  Uncovertebral joints were stripped. There was some disk protrusion, spondylitic changes, and degenerative uncovertebral joints in both gutters, but there was no was no moderate- sized disk causing compression as had been present at C6-7.  There was some right paracentral  disk extrusion with slight displacement and this was decompressed until the dura was fully visualized.  Thrombin soaked Gelfoam was placed, then removed and then placement of the graft with __________ traction.  Appropriate plate was selected, held with a prong, checked under fluoroscopy and then holes were filled, rechecked under fluoroscopy and screws locked down.  Graft, plate, screws were in good position.  After irrigation with saline solution, the finger was placed down toward the chest anterior to the vertebral body to make sure there was room for any egress of fluid.  Operative field  was dry.  Copious irrigation, closer of the platysma, after Hemovac was placed.  Separate stab incision in  and out technique in line with the skin incision and placed down over the top of the plate.  A 3-0 on the platysma, 4-0 Vicryl subcuticular closure.  Tincture of benzoin, Steri-Strips, Marcaine infiltration, 4x4s, soft cervical collar tape.  Instrument count and needle count was correct.  The patient tolerated the procedure well and was transferred to recovery room in stable condition.     Tina Acevedo C. Ophelia Charter, M.D.     MCY/MEDQ  D:  02/03/2013  T:  02/04/2013  Job:  [id number]

## 2013-02-04 NOTE — Care Management Utilization Note (Signed)
Utilization review completed. Deonna Krummel, RN BSN 

## 2013-02-05 ENCOUNTER — Encounter (HOSPITAL_COMMUNITY): Payer: Self-pay | Admitting: Orthopaedic Surgery

## 2013-02-22 NOTE — Discharge Summary (Signed)
Physician Discharge Summary  Patient ID: Tina Acevedo MRN: [id number] DOB/AGE: 53-02-1961 53 y.o.  Admit date: 02/03/2013 Discharge date: 02/02/2013  Admission Diagnoses:  HNP (herniated nucleus pulposus), cervical C6-7 and cervical spondylosis C5-6  Discharge Diagnoses:  Principal Problem:   HNP (herniated nucleus pulposus), cervical same as above.  Past Medical History  Diagnosis Date  . Hypertension   . Anxiety   . Asthma   . Sleep apnea   . Arthritis     Surgeries: Procedure(s): ANTERIOR CERVICAL DECOMPRESSION/DISCECTOMY FUSION C5-6 and C6-7, 2 LEVELS on 02/03/2013   Consultants (if any):  none  Discharged Condition: Improved  Hospital Course: Tina Acevedo is an 52 y.o. female who was admitted 02/03/2013 with a diagnosis of HNP (herniated nucleus pulposus), cervical and went to the operating room on 02/03/2013 and underwent the above named procedures.    She was given perioperative antibiotics:  Anti-infectives   Start     Dose/Rate Route Frequency Ordered Stop   02/03/13 1930  ceFAZolin (ANCEF) IVPB 1 g/50 mL premix     1 g 100 mL/hr over 30 Minutes Intravenous  Once 02/03/13 1737 02/03/13 2053   02/03/13 0600  ceFAZolin (ANCEF) IVPB 2 g/50 mL premix     2 g 100 mL/hr over 30 Minutes Intravenous On call to O.R. 02/02/13 1430 02/03/13 1322    Good relief of preop UE numbness and weakness.  She was given sequential compression devices, early ambulation for DVT prophylaxis. She benefited maximally from the hospital stay and there were no complications.    Recent vital signs:  Filed Vitals:   02/04/13 0433  BP: 99/56  Pulse: 57  Temp: 97.5 F (36.4 C)  Resp: 16    Recent laboratory studies:  Lab Results  Component Value Date   HGB 12.8 02/03/2013   Lab Results  Component Value Date   WBC 5.4 02/03/2013   PLT 199 02/03/2013   Lab Results  Component Value Date   INR 0.97 02/03/2013   Lab Results  Component Value Date   NA 140 02/03/2013   K 4.1 02/03/2013   CL 102 02/03/2013   CO2 27 02/03/2013   BUN 11 02/03/2013   CREATININE 0.87 02/03/2013   GLUCOSE 102* 02/03/2013    Discharge Medications:     Medication List         albuterol 108 (90 BASE) MCG/ACT inhaler  Commonly known as:  PROVENTIL HFA;VENTOLIN HFA  Inhale 2 puffs into the lungs every 6 (six) hours as needed for wheezing or shortness of breath.     buPROPion 150 MG 24 hr tablet  Commonly known as:  WELLBUTRIN XL  Take 150 mg by mouth daily.     cetirizine 10 MG tablet  Commonly known as:  ZYRTEC  Take 10 mg by mouth daily.     citalopram 20 MG tablet  Commonly known as:  CELEXA  Take 20 mg by mouth daily.     diphenhydramine-acetaminophen 25-500 MG Tabs  Commonly known as:  TYLENOL PM  Take 1 tablet by mouth at bedtime as needed (for sleep).     hydrochlorothiazide 25 MG tablet  Commonly known as:  HYDRODIURIL  Take 25 mg by mouth daily.     losartan 50 MG tablet  Commonly known as:  COZAAR  Take 50 mg by mouth daily.     methocarbamol 500 MG tablet  Commonly known as:  ROBAXIN  Take 1 tablet (500 mg total) by mouth every 6 (six) hours as needed  for muscle spasms (spasm).     oxyCODONE-acetaminophen 5-325 MG per tablet  Commonly known as:  ROXICET  Take 1-2 tablets by mouth every 4 (four) hours as needed for severe pain.     zolpidem 10 MG tablet  Commonly known as:  AMBIEN  Take 10 mg by mouth daily as needed. For sleep        Diagnostic Studies: Dg Chest 2 View  02/03/2013   CLINICAL DATA:  Cervical spine surgery  EXAM: CHEST  2 VIEW  COMPARISON:  None.  FINDINGS: The heart size and mediastinal contours are within normal limits. Both lungs are clear. The visualized skeletal structures are unremarkable.  IMPRESSION: No active cardiopulmonary disease.   Electronically Signed   By: Elige Ko   On: 02/03/2013 12:02   Dg Cervical Spine 2-3 Views  02/03/2013   CLINICAL DATA:  Cervical disc disease.  EXAM: CERVICAL SPINE - 2-3 VIEW;  DG C-ARM 1-60 MIN  COMPARISON:  Radiographs dated 02/03/2013  FINDINGS: Multiple C-arm images demonstrate the patient has undergone anterior cervical fusion at C5-6 and C6-7. Anterior plate and screws appear in good position. Alignment of the vertebrae is anatomic. Interbody fusion plugs in place.  IMPRESSION: Anterior cervical fusions performed at C5-6 and C6-7.   Electronically Signed   By: Geanie Cooley M.D.   On: 02/03/2013 16:39   Dg Cervical Spine 2-3 Views  02/03/2013   CLINICAL DATA:  Anterior cervical decompression, discectomy, and 2 level fusion  EXAM: CERVICAL SPINE - 2-3 VIEW  COMPARISON:  02/03/2013  FINDINGS: Hemostats project at the C5-6 disc space level. Stent a tractor is identified within the anterior soft tissues at the base of the neck. An endotracheal tube is appreciated.  IMPRESSION: Cervical spine localization evaluation.   Electronically Signed   By: Salome Holmes M.D.   On: 02/03/2013 14:18   Dg Cervical Spine 2 Or 3 Views  02/03/2013   CLINICAL DATA:  Preop cervical spondylosis  EXAM: CERVICAL SPINE - 2-3 VIEW  COMPARISON:  None.  FINDINGS: The cervical spine is visualized to the level of C7.The vertebral body heights are maintained. The alignment is normal. The prevertebral soft tissues are normal. There is no acute fracture or static listhesis. There is mild degenerative disc disease and degenerative facet arthropathy at C6-C7.  IMPRESSION: Mild degenerative disc disease at C6-7.   Electronically Signed   By: Elige Ko   On: 02/03/2013 12:05   Dg C-arm 1-60 Min  02/03/2013   CLINICAL DATA:  Cervical disc disease.  EXAM: CERVICAL SPINE - 2-3 VIEW; DG C-ARM 1-60 MIN  COMPARISON:  Radiographs dated 02/03/2013  FINDINGS: Multiple C-arm images demonstrate the patient has undergone anterior cervical fusion at C5-6 and C6-7. Anterior plate and screws appear in good position. Alignment of the vertebrae is anatomic. Interbody fusion plugs in place.  IMPRESSION: Anterior cervical  fusions performed at C5-6 and C6-7.   Electronically Signed   By: Geanie Cooley M.D.   On: 02/03/2013 16:39    Disposition: 01-Home or Self Care Discharge instructions: No lifting greater than 10 lbs. No overhead use of arms. Avoid bending,and twisting neck. Wear collar at all times. Walk in house for first week them may start to get out slowly increasing distance up to one mile by 3 weeks post op. Keep incision dry for 3 days, may then bathe and wet incision using a covered collar when showering. Call if any fevers >101, chills, or increasing numbness or weakness or increased swelling  or drainage.       Follow-up Information   Follow up with Eldred Manges, MD. Schedule an appointment as soon as possible for a visit in 2 weeks. (or as scheduled)    Specialty:  Orthopedic Surgery   Contact information:   [address] [place] [address] [phone]        Signed: Wende Neighbors 02/22/2013, 9:19 AM

## 2015-07-22 IMAGING — RF DG CERVICAL SPINE 2 OR 3 VIEWS
1 series · 4 of 4 positions shown · non-contrast
Comparison: Radiographs dated 02/03/2013

CLINICAL DATA: Cervical disc disease.

EXAM:
CERVICAL SPINE - 2-3 VIEW; DG C-ARM 1-60 MIN

[Series 1: run · 4 of 4 slices shown]
[im 1/4]
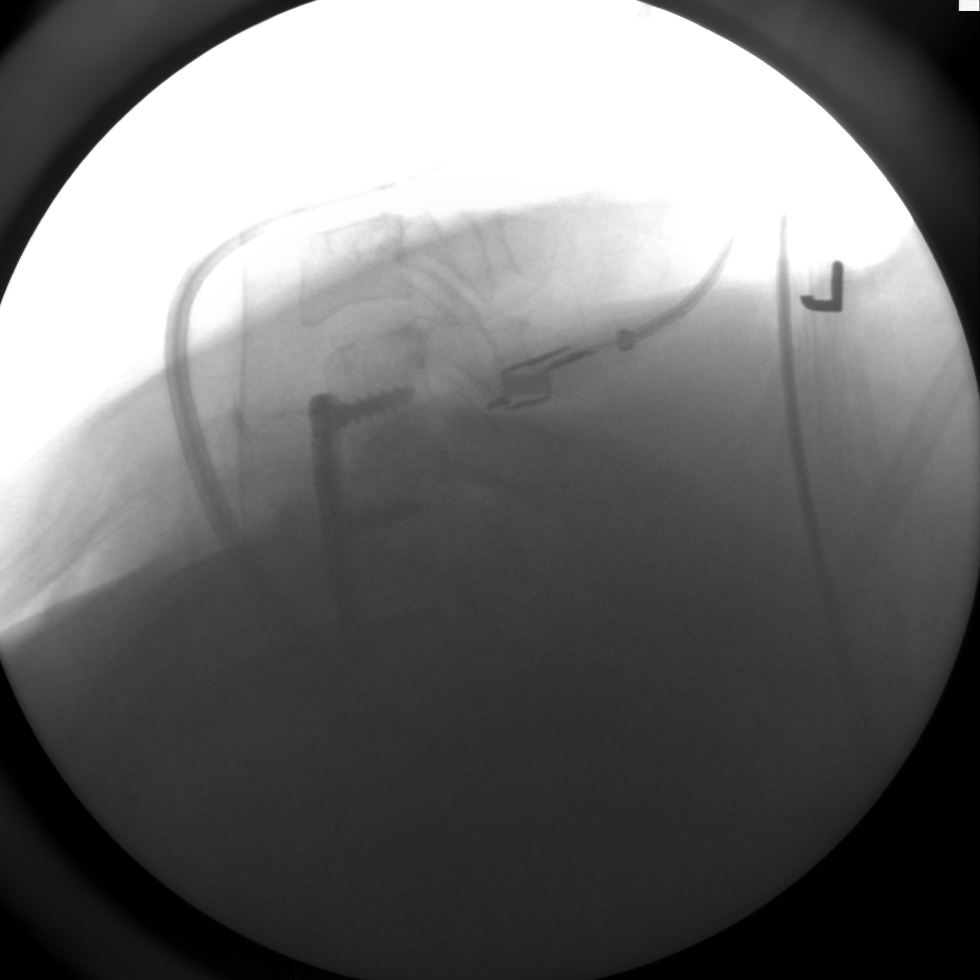
[im 2/4]
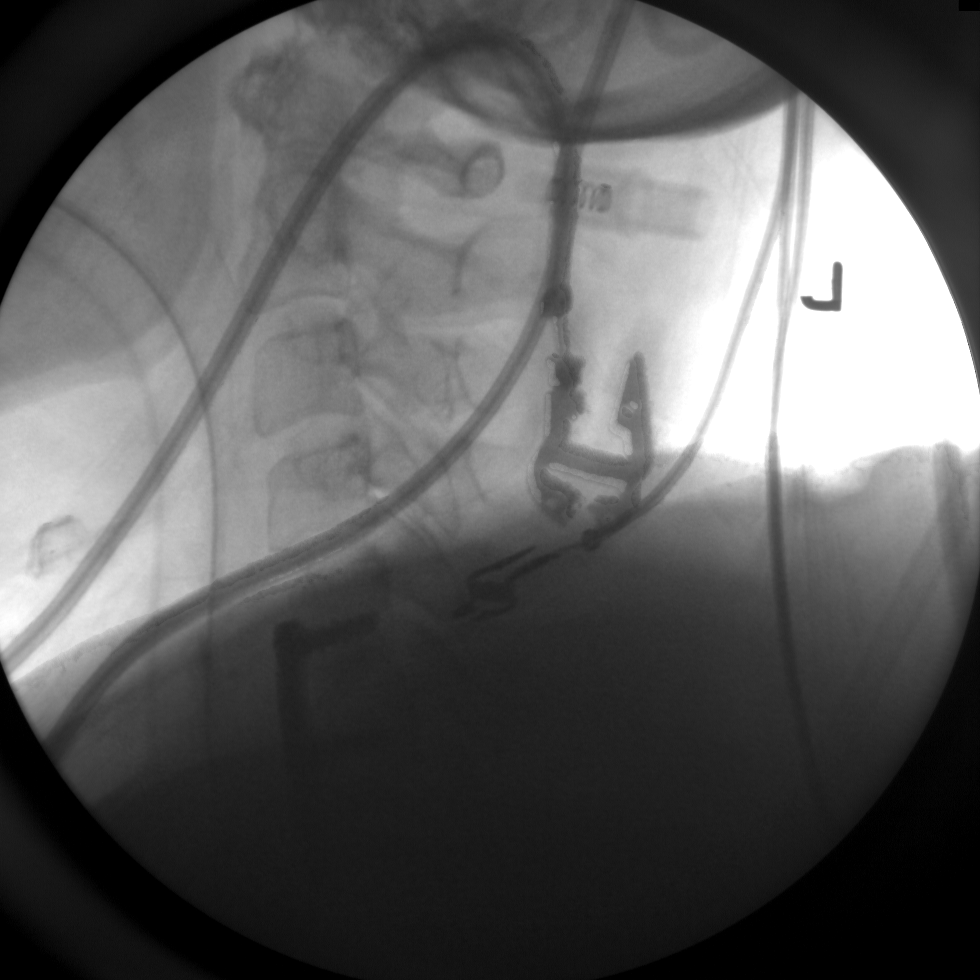
[im 3/4]
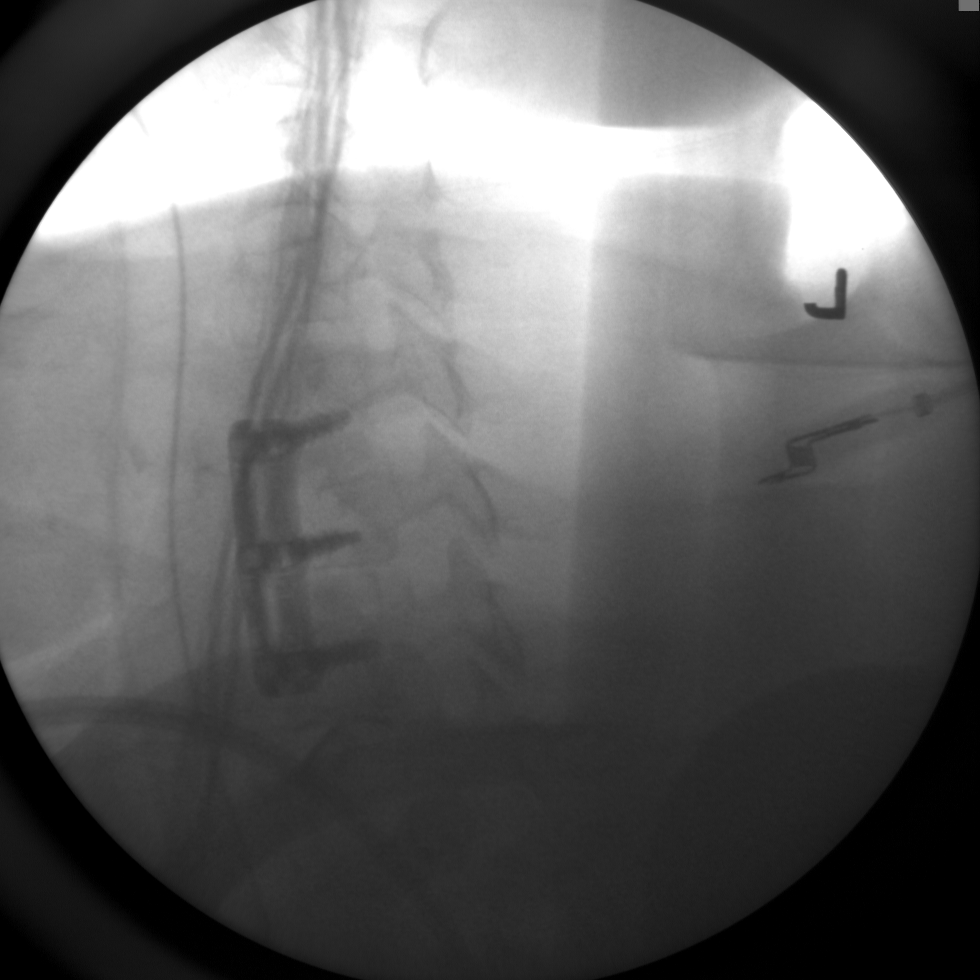
[im 4/4]
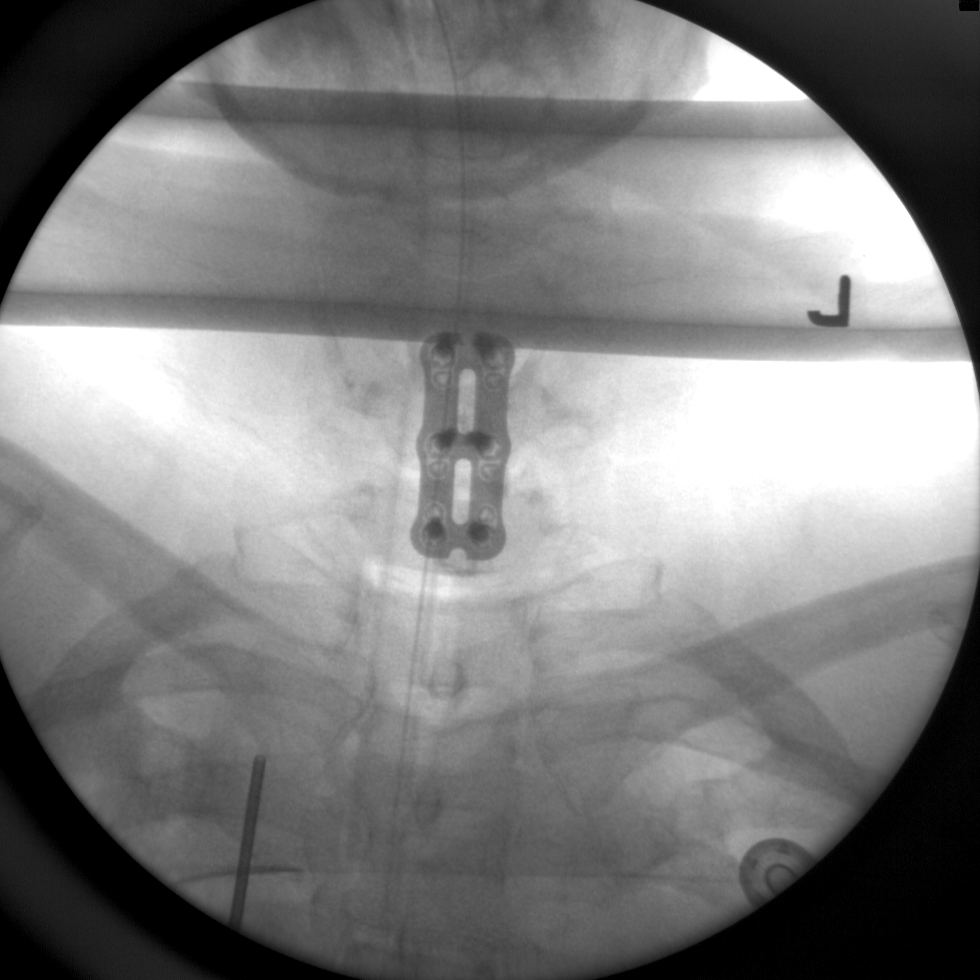

[4 of 4 positions shown; findings below may reference images not displayed]

FINDINGS: Multiple C-arm images demonstrate the patient has undergone anterior
cervical fusion at C5-6 and C6-7. Anterior plate and screws appear
in good position. Alignment of the vertebrae is anatomic. Interbody
fusion plugs in place.
IMPRESSION: Anterior cervical fusions performed at C5-6 and C6-7.

## 2020-07-05 ENCOUNTER — Ambulatory Visit (INDEPENDENT_AMBULATORY_CARE_PROVIDER_SITE_OTHER): Payer: No Typology Code available for payment source | Admitting: Orthopaedic Surgery

## 2020-07-05 ENCOUNTER — Telehealth: Payer: Self-pay

## 2020-07-05 ENCOUNTER — Encounter: Payer: Self-pay | Admitting: Orthopaedic Surgery

## 2020-07-05 ENCOUNTER — Ambulatory Visit (INDEPENDENT_AMBULATORY_CARE_PROVIDER_SITE_OTHER): Payer: No Typology Code available for payment source

## 2020-07-05 VITALS — Ht 61.5 in | Wt 229.0 lb

## 2020-07-05 DIAGNOSIS — M25561 Pain in right knee: Secondary | ICD-10-CM

## 2020-07-05 DIAGNOSIS — G8929 Other chronic pain: Secondary | ICD-10-CM

## 2020-07-05 DIAGNOSIS — M1711 Unilateral primary osteoarthritis, right knee: Secondary | ICD-10-CM | POA: Diagnosis not present

## 2020-07-05 MED ORDER — METHYLPREDNISOLONE ACETATE 40 MG/ML IJ SUSP
40.0000 mg | INTRAMUSCULAR | Status: AC | PRN
  Administered 2020-07-05: 40 mg via INTRA_ARTICULAR

## 2020-07-05 MED ORDER — LIDOCAINE HCL 1 % IJ SOLN
3.0000 mL | INTRAMUSCULAR | Status: AC | PRN
  Administered 2020-07-05: 3 mL

## 2020-07-05 NOTE — Telephone Encounter (Signed)
Right knee gel injection  

## 2020-07-05 NOTE — Progress Notes (Signed)
Office Visit Note   Patient: Tina Acevedo           Date of Birth: [birth year]           MRN: [id number] Visit Date: 07/05/2020              Requested by: Loleta Dicker, FNP [address] [place],  [place] [address] PCP: Loleta Dicker, FNP   Assessment & Plan: Visit Diagnoses:  1. Chronic pain of right knee   2. Unilateral primary osteoarthritis, right knee     Plan: I did go over with her her x-ray findings of her knee and looked at her previous arthroscopic pictures.  The right knee is showing significant arthritis at this point.  She is a good candidate for hyaluronic acid for that knee.  I did place a steroid injection her right knee today to temporize her pain.  I have recommended weight loss as well.  She is to work on Dance movement psychotherapist exercises.  I gave her some handouts on hyaluronic acid and hopefully we get this approved for her knee.  At some point she may end up needing knee replacement surgery but we would want her to lose some more weight to get her BMI below 40.  All questions and concerns were answered and addressed.  Follow-Up Instructions: No follow-ups on file.   Orders:  Orders Placed This Encounter  Procedures  . Large Joint Inj  . XR Knee 1-2 Views Right   No orders of the defined types were placed in this encounter.     Procedures: Large Joint Inj: R knee on 07/05/2020 3:49 PM Indications: diagnostic evaluation and pain Details: 22 G 1.5 in needle, superolateral approach  Arthrogram: No  Medications: 3 mL lidocaine 1 %; 40 mg methylPREDNISolone acetate 40 MG/ML Outcome: tolerated well, no immediate complications Procedure, treatment alternatives, risks and benefits explained, specific risks discussed. Consent was given by the patient. Immediately prior to procedure a time out was called to verify the correct patient, procedure, equipment, support staff and site/side marked as required. Patient was prepped and draped in the usual sterile  fashion.       Clinical Data: No additional findings.   Subjective: No chief complaint on file. The patient comes in today with worsening right knee pain.  I actually performed an arthroscopic surgery on that knee 10 years ago.  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  Her knee pain has become daily and chronic.  Is been getting worse over the last several weeks.  She does report swelling and a constant pain.  She denies any acute change in her medical status otherwise.  She is not a diabetic.  She does a lot of walking.  Her BMI is 42.57.  Most of her pain is on the medial aspect of her right knee.  HPI  Review of Systems She currently denies any headache, chest pain, shortness of breath, fever, chills, nausea, vomiting  Objective: Vital Signs: Ht 5' 1.5" (1.562 m)   Wt 229 lb (103.9 kg)   BMI 42.57 kg/m   Physical Exam She is alert and orient x3 and in no acute distress Ortho Exam Examination of her right knee shows no effusion but significant medial joint line tenderness.  There is varus malalignment and patellofemoral crepitation  throughout the arc of motion.  The knee is ligamentously stable. Specialty Comments:  No specialty comments available.  Imaging: XR Knee 1-2 Views Right  Result Date: 07/05/2020 2 views of the right knee show significant arthritic changes especially involving the medial compartment and patellofemoral joint.  There is near bone-on-bone wear of the medial compartment.  There are osteophytes in the patellofemoral joint and the medial and lateral compartments.  There is varus malalignment.    PMFS History: Patient Active Problem List   Diagnosis Date Noted  . Unilateral primary osteoarthritis, right knee 07/05/2020  . HNP (herniated nucleus pulposus), cervical 02/03/2013    Past Medical History:  Diagnosis Date  . Anxiety   . Arthritis   . Asthma   . Hypertension   . Sleep apnea     No family history on file.  Past Surgical History:  Procedure Laterality Date  . ABDOMINAL SURGERY     CYST REMOVED  . ANTERIOR CERVICAL DECOMP/DISCECTOMY FUSION N/A 02/03/2013   Procedure: ANTERIOR CERVICAL DECOMPRESSION/DISCECTOMY FUSION 2 LEVELS;  Surgeon: Eldred Manges, MD;  Location: [facility] OR;  Service: Orthopedics;  Laterality: N/A;  C5-6 C6-7 Anterior Cervical Discectomy and Fusion, Allograft, Plate  . CHOLECYSTECTOMY     1996  . KNEE ARTHROSCOPY W/ MENISCAL REPAIR     rt    knee   2012  . KNEE SURGERY     1991 RT KNEE LIPOMA   . TUBAL LIGATION     1995   Social History   Occupational History  . Not on file  Tobacco Use  . Smoking status: Never Smoker  . Smokeless tobacco: Not on file  Substance and Sexual Activity  . Alcohol use: Yes  . Drug use: No  . Sexual activity: Not on file

## 2020-07-07 NOTE — Telephone Encounter (Signed)
Noted  

## 2020-08-21 ENCOUNTER — Telehealth: Payer: Self-pay

## 2020-08-21 NOTE — Telephone Encounter (Signed)
VOB has been submitted for Monovisc, right knee. Pending BV. 

## 2020-09-11 ENCOUNTER — Telehealth: Payer: Self-pay

## 2020-09-11 NOTE — Telephone Encounter (Signed)
Called and left a VM for patient to CB concerning gel injection.  Patient's insurance only covers a certain amount and anything over that amount will be OOP.  Called to initiate PA with [facility], but was advised that no PA is required.

## 2020-10-16 ENCOUNTER — Telehealth: Payer: Self-pay

## 2020-10-16 ENCOUNTER — Telehealth: Payer: Self-pay | Admitting: Orthopaedic Surgery

## 2020-10-16 NOTE — Telephone Encounter (Signed)
Talked with patient concerning gel injection. Appt. Scheduled.

## 2020-10-16 NOTE — Telephone Encounter (Signed)
Approved for Monovisc, right knee. [employer] only pays $250.00 anything after/over $250.00, will be OOP and patient is aware of this. Co-pay of $75.00 No PA required  Appt. 11/01/2020 with Dr. Magnus Ivan

## 2020-10-16 NOTE — Telephone Encounter (Signed)
Pt called about gel injections. Please call pt back about this matter. Phone number is [phone].

## 2020-11-01 ENCOUNTER — Ambulatory Visit: Payer: No Typology Code available for payment source | Admitting: Orthopaedic Surgery

## 2020-11-01 ENCOUNTER — Other Ambulatory Visit: Payer: Self-pay

## 2020-11-01 ENCOUNTER — Encounter: Payer: Self-pay | Admitting: Orthopaedic Surgery

## 2020-11-01 DIAGNOSIS — M1711 Unilateral primary osteoarthritis, right knee: Secondary | ICD-10-CM | POA: Diagnosis not present

## 2020-11-01 DIAGNOSIS — G8929 Other chronic pain: Secondary | ICD-10-CM

## 2020-11-01 DIAGNOSIS — M25561 Pain in right knee: Secondary | ICD-10-CM

## 2020-11-01 MED ORDER — HYALURONAN 88 MG/4ML IX SOSY
88.0000 mg | PREFILLED_SYRINGE | INTRA_ARTICULAR | Status: AC | PRN
  Administered 2020-11-01: 88 mg via INTRA_ARTICULAR

## 2020-11-01 NOTE — Progress Notes (Signed)
   Procedure Note  Patient: Tina Acevedo             Date of Birth: 1960/05/01           MRN: [id number]             Visit Date: 11/01/2020  Procedures: Visit Diagnoses:  1. Chronic pain of right knee   2. Unilateral primary osteoarthritis, right knee     Large Joint Inj: R knee on 11/01/2020 9:40 AM Indications: diagnostic evaluation and pain Details: 22 G 1.5 in needle, superolateral approach  Arthrogram: No  Medications: 88 mg Hyaluronan 88 MG/4ML Outcome: tolerated well, no immediate complications Procedure, treatment alternatives, risks and benefits explained, specific risks discussed. Consent was given by the patient. Immediately prior to procedure a time out was called to verify the correct patient, procedure, equipment, support staff and site/side marked as required. Patient was prepped and draped in the usual sterile fashion.   The patient comes in today for scheduled hyaluronic acid injection with Monovisc to treat the pain from osteoarthritis with her right knee.  She has tried and failed other forms of conservative treatment including steroid injections.  She is a very active 60 year old female.  She is been working on quad strengthening exercises and activity modification.  Both she and I feel that this is the next appropriate step to treat the pain from osteoarthritis with her knee for hopefully good long-term results.  Examination of her knee today shows no effusion.  There is global tenderness with good range of motion.  She does have pain with weightbearing right now.  She did tolerate Monovisc in her right knee well today.  All question concerns were answered and addressed.  She knows the earliest we can do this again in 6 months.
